# Patient Record
Sex: Male | Born: 1935 | Race: White | Hispanic: No | Marital: Married | Smoking: Former smoker
Health system: Southern US, Community
[De-identification: ages and names within clinical notes are randomized; demographics above are authoritative.]

## PROBLEM LIST (undated history)

## (undated) DIAGNOSIS — H3530 Unspecified macular degeneration: Secondary | ICD-10-CM

## (undated) DIAGNOSIS — F0390 Unspecified dementia without behavioral disturbance: Secondary | ICD-10-CM

## (undated) HISTORY — PX: OTHER SURGICAL HISTORY: SHX169

---

## 1998-02-27 ENCOUNTER — Ambulatory Visit (HOSPITAL_COMMUNITY): Admission: RE | Admit: 1998-02-27 | Discharge: 1998-02-27 | Payer: Self-pay | Admitting: Neurosurgery

## 1998-03-30 ENCOUNTER — Ambulatory Visit (HOSPITAL_COMMUNITY): Admission: RE | Admit: 1998-03-30 | Discharge: 1998-03-30 | Payer: Self-pay | Admitting: Neurosurgery

## 1998-04-12 ENCOUNTER — Ambulatory Visit (HOSPITAL_COMMUNITY): Admission: RE | Admit: 1998-04-12 | Discharge: 1998-04-12 | Payer: Self-pay | Admitting: Neurosurgery

## 1998-04-26 ENCOUNTER — Ambulatory Visit (HOSPITAL_COMMUNITY): Admission: RE | Admit: 1998-04-26 | Discharge: 1998-04-26 | Payer: Self-pay | Admitting: Neurosurgery

## 1998-05-16 ENCOUNTER — Ambulatory Visit (HOSPITAL_COMMUNITY): Admission: RE | Admit: 1998-05-16 | Discharge: 1998-05-16 | Payer: Self-pay | Admitting: Neurosurgery

## 1998-05-24 ENCOUNTER — Inpatient Hospital Stay (HOSPITAL_COMMUNITY): Admission: RE | Admit: 1998-05-24 | Discharge: 1998-05-26 | Payer: Self-pay | Admitting: Neurosurgery

## 2005-02-04 ENCOUNTER — Encounter: Admission: RE | Admit: 2005-02-04 | Discharge: 2005-02-04 | Payer: Self-pay | Admitting: Internal Medicine

## 2005-04-03 ENCOUNTER — Ambulatory Visit: Payer: Self-pay | Admitting: Internal Medicine

## 2005-04-08 ENCOUNTER — Ambulatory Visit (HOSPITAL_COMMUNITY): Admission: RE | Admit: 2005-04-08 | Discharge: 2005-04-08 | Payer: Self-pay | Admitting: Internal Medicine

## 2005-04-08 ENCOUNTER — Ambulatory Visit: Payer: Self-pay | Admitting: Internal Medicine

## 2007-06-17 ENCOUNTER — Inpatient Hospital Stay (HOSPITAL_COMMUNITY): Admission: RE | Admit: 2007-06-17 | Discharge: 2007-06-20 | Payer: Self-pay | Admitting: Orthopedic Surgery

## 2010-08-09 ENCOUNTER — Other Ambulatory Visit (HOSPITAL_COMMUNITY): Payer: Self-pay | Admitting: Internal Medicine

## 2010-08-09 ENCOUNTER — Ambulatory Visit (HOSPITAL_COMMUNITY)
Admission: RE | Admit: 2010-08-09 | Discharge: 2010-08-09 | Disposition: A | Discharge status: Home / Self Care | Payer: MEDICARE | Source: Ambulatory Visit | Attending: Internal Medicine | Admitting: Internal Medicine

## 2010-08-09 DIAGNOSIS — M899 Disorder of bone, unspecified: Secondary | ICD-10-CM | POA: Insufficient documentation

## 2010-08-09 DIAGNOSIS — M1610 Unilateral primary osteoarthritis, unspecified hip: Secondary | ICD-10-CM | POA: Insufficient documentation

## 2010-08-09 DIAGNOSIS — R109 Unspecified abdominal pain: Secondary | ICD-10-CM | POA: Insufficient documentation

## 2010-08-09 DIAGNOSIS — W19XXXA Unspecified fall, initial encounter: Secondary | ICD-10-CM | POA: Insufficient documentation

## 2010-08-09 DIAGNOSIS — M47817 Spondylosis without myelopathy or radiculopathy, lumbosacral region: Secondary | ICD-10-CM | POA: Insufficient documentation

## 2010-08-09 DIAGNOSIS — M169 Osteoarthritis of hip, unspecified: Secondary | ICD-10-CM | POA: Insufficient documentation

## 2010-08-09 DIAGNOSIS — M51379 Other intervertebral disc degeneration, lumbosacral region without mention of lumbar back pain or lower extremity pain: Secondary | ICD-10-CM | POA: Insufficient documentation

## 2010-08-09 DIAGNOSIS — M5137 Other intervertebral disc degeneration, lumbosacral region: Secondary | ICD-10-CM | POA: Insufficient documentation

## 2010-08-09 DIAGNOSIS — M25559 Pain in unspecified hip: Secondary | ICD-10-CM | POA: Insufficient documentation

## 2010-11-15 ENCOUNTER — Other Ambulatory Visit: Payer: Self-pay | Admitting: Internal Medicine

## 2010-11-15 DIAGNOSIS — R413 Other amnesia: Secondary | ICD-10-CM

## 2010-11-15 DIAGNOSIS — M545 Low back pain: Secondary | ICD-10-CM

## 2010-11-16 ENCOUNTER — Other Ambulatory Visit: Payer: MEDICARE

## 2010-11-20 ENCOUNTER — Other Ambulatory Visit: Payer: MEDICARE

## 2010-11-20 NOTE — H&P (Signed)
NAME:  Chase Fuller, Chase Fuller NO.:  [id number]   MEDICAL RECORD NO.:  [id number]          PATIENT TYPE:  INP   LOCATION:  1606                         FACILITY:  [facility]   PHYSICIAN:  Ollen Gross, M.D.    DATE OF BIRTH:  [birth year]   DATE OF ADMISSION:  06/17/2007  DATE OF DISCHARGE:                              HISTORY & PHYSICAL   Date of office visit history and physical June 09, 2007.  Date of  admission June 17, 2007.   CHIEF COMPLAINT:  Left knee pain.   HISTORY OF PRESENT ILLNESS:  The patient is 77 year old male well-known  Dr. Ollen Gross, having known end-stage arthritis of the left knee  with bone-on-bone and a valgus malalignment deformity.  It has been  progressive in nature, and he has reached the point where he would like  to have something done about it.  Risks and benefits have been  discussed, and he elects to proceed with surgery.   ALLERGIES:  NO KNOWN DRUG ALLERGIES.   CURRENT MEDICATIONS:  None.   PAST MEDICAL HISTORY:  Negative with exception of history of a back  fracture treated conservatively.   PAST SURGICAL HISTORY:  Back surgery x2 for nerves and bilateral  shoulder surgery.   SOCIAL HISTORY:  Married, retired, one-pack-per-day smoker.  No alcohol.  Two children.   FAMILY HISTORY:  Father deceased at age 78 secondary to MI.  Mother  deceased age at 70 in good health other than a hip fracture.   REVIEW OF SYSTEMS:  GENERAL:  No fevers, chills or night sweats.  NEUROLOGICAL:  No seizures, syncope or paralysis.  RESPIRATORY:  No  shortness breath, productive cough or hemoptysis.  CARDIOVASCULAR:  No  chest pain, angina, orthopnea.  GASTROINTESTINAL:  No nausea, vomiting,  diarrhea, constipation.  GENITOURINARY:  No dysuria, hematuria,  discharge.  MUSCULOSKELETAL:  Left knee.   PHYSICAL EXAMINATION:  VITAL SIGNS:  Pulse 76, respirations 14, blood  pressure 148/72.  GENERAL:  An 77 year old, tall, slender, white male, thin  frame no acute  distress. Average historian.  Alert, oriented and cooperative.  HEENT:  Normocephalic, atraumatic.  Pupils round and reactive.  Oropharynx clear.  EOMs intact.  NECK:  Supple.  CHEST:  Clear to anterior/posterior chest walls.  No rhonchi, rales or  wheezing.  HEART:  Regular rate and rhythm.  Systolic ejection murmur grade 2/6  best heard over aortic point.  ABDOMEN:  Soft, flat, nontender.  Bowel sounds present.  RECTAL, BREAST, GENITALIA:  Not done and not pertinent to present  illness.  EXTREMITIES:  Left knee range of motion 1 to 135, marked crepitus.  He  has a little bit of pseudolaxity with some varus stressing, comes back  to normal alignment.   IMPRESSION:  Osteoarthritis, left knee.   PLAN:  The patient was admitted to the [facility] to undergo a  left total knee replacement arthroplasty.  Surgery will be performed by  Ollen Gross.      Alexzandrew L. Perkins, P.A.C.      Ollen Gross, M.D.  Electronically Signed  ALP/MEDQ  D:  06/16/2007  T:  06/17/2007  Job:  [id number]   cc:   Ollen Gross, M.D.  Fax: [phone]   Geoffry Paradise, M.D.  Fax: [phone]

## 2010-11-20 NOTE — Op Note (Signed)
NAME:  Chase Fuller, Chase Fuller NO.:  [id number]   MEDICAL RECORD NO.:  [id number]          PATIENT TYPE:  INP   LOCATION:  0001                         FACILITY:  [facility]   PHYSICIAN:  Ollen Gross, M.D.    DATE OF BIRTH:  1922-03-25   DATE OF PROCEDURE:  06/17/2007  DATE OF DISCHARGE:                               OPERATIVE REPORT   PREOPERATIVE DIAGNOSIS:  Osteoarthritis, left knee.   POSTOPERATIVE DIAGNOSIS:  Osteoarthritis, left knee.   PROCEDURE:  Left total knee arthroplasty.   SURGEON:  Ollen Gross, M.D.   ASSISTANT:  Avel Peace, P.A.-C.   ANESTHESIA:  Spinal.   ESTIMATED BLOOD LOSS:  Minimal.   DRAINS:  None.   TOURNIQUET TIME:  34 minutes at 300 mmHg.   COMPLICATIONS:  None.   CONDITION:  Stable to recovery.   BRIEF CLINICAL NOTE:  Chase Fuller is an 77 year old male with end-stage  valgus arthritis of his left knee with progressively worsening pain and  dysfunction.  He has failed nonoperative management and presents for  total knee arthroplasty.   PROCEDURE IN DETAIL:  After successful administration of spinal  anesthetic, a tourniquet was placed high on the left thigh and left  lower extremity prepped and draped in usual sterile fashion.  Extremities wrapped in Esmarch, knee flexed, tourniquet inflated to 300  mmHg.  Midline incision made with a 10 blade through subcutaneous tissue  to the level of the extensor mechanism.  A fresh blade was used make a  lateral parapatellar arthrotomy.  I made the lateral portion to do his  valgus deformity.  Soft tissue of the proximal lateral tibia  subperiosteally elevated to the joint line with the knife and around to  the posterolateral corner but not including the structures of the  posterolateral corner.  The patella was everted medially, knee flexed 90  degrees, ACL and PCL removed.  Drill was used create a starting hole in  the distal femur, and canal was thoroughly irrigated.  A 5-degree left  valgus  alignment guide was placed, and referencing off the posterior  condyles, rotation was marked and the block pinned to remove 10 mm off  the distal femur.  Distal femoral resection was made with an oscillating  saw.  Sizing blocks placed; size 4 was most appropriate.  Rotations  marked off the epicondylar axis.  A size 4 cutting block was placed, and  the anterior, posterior and chamfer cuts were made.   Tibia subluxed forward and menisci were removed.  The extramedullary  tibial alignment guide was placed referencing proximally at the medial  aspect of the tibial tubercle and distally along the second metatarsal  axis and tibial crest.  The block was pinned to remove approximately 2  mm off the deficient lateral side.  Tibial resection was made with an  oscillating saw.  Size 3 was the most appropriate tibial component, and  the proximal tibia was prepared with the modular drill and keel punch  for a size 3.  Femoral preparation was completed with the intercondylar  cut.   Size 3 mobile bearing  tibial trial, size 4 posterior stabilized femoral  trial and a 12.5-mm posterior stabilized rotating platform insert trial  were placed.  With the 12.5, full extension was achieved with excellent  varus and valgus balance, and anterior and posterior balance throughout  full range of motion.  Patella was everted and thickness measured to be  25 mm.  Freehand resection was taken to 15 mm, 38 template was placed,  lug holes were drilled, trial patella was placed and it tracked  normally.  Osteophytes removed off the posterior femur with the trial in  place.  All trials were removed, and the cut bone surfaces were prepared  with pulsatile lavage.  Cement was mixed, and once ready for  implantation, the size 3 mobile bearing tibial tray, size 4 posterior  stabilized femur and 38 patella were cemented in place.  Patella was  held with clamp.  Trial 12.5 inserts were placed, knee held in full  extension  and all extruded cement removed.  When the cement was fully  hardened, then the wound was copiously irrigated with saline solution,  trial removed and FloSeal injected on the posterior capsule.  The  permanent 12.5-mm posterior stabilized rotating platform insert was  placed into the tibial tray.  The FloSeal was then injected in the  medial and lateral gutters and suprapatellar area.  Tourniquet was  released for total time of 34 minutes.  Moist sponge was placed for  about 2 minutes.  It was then removed, and the minimal bleeding was  encountered.  That which was encountered was stopped with  electrocautery.  The wound was again irrigated and the arthrotomy closed  with interrupted #1 PDS leaving open a small area from the superior to  inferior pole of the patella to serve as a mini lateral release.  The  flexion against gravity was 140 degrees and the patella tracked  normally.  Subcutaneous tissue was closed with interrupted 2-0 Vicryl  and subcuticular running 4-0 Monocryl.  The incisions were cleaned and  dried and Steri-Strips and a bulky sterile dressing applied.  He was  then awakened and transferred to recovery in stable condition.      Ollen Gross, M.D.  Electronically Signed     FA/MEDQ  D:  06/17/2007  T:  06/17/2007  Job:  [id number]

## 2010-11-23 NOTE — Discharge Summary (Signed)
NAME:  Chase Fuller, Chase Fuller NO.:  [id number]   MEDICAL RECORD NO.:  [id number]          PATIENT TYPE:  INP   LOCATION:  1606                         FACILITY:  [facility]   PHYSICIAN:  Ollen Gross, M.D.    DATE OF BIRTH:  01/30/1922   DATE OF ADMISSION:  06/17/2007  DATE OF DISCHARGE:  06/20/2007                               DISCHARGE SUMMARY   ADMITTING DIAGNOSIS:  Left knee osteoarthritis.   DISCHARGE DIAGNOSIS:  1. Osteoarthritis, left knee, status post left total knee      arthroplasty.  2. Mild postop blood loss anemia, did not require transfusion.   PROCEDURE:  June 17, 2007, left total knee.  Surgeon:  Dr. Lequita Halt.  Assistant:  Avel Peace PA-C. Performed under spinal anesthesia.   CONSULTATIONS:  None.   BRIEF HISTORY:  Chase Fuller is an 77 year old male with end-stage arthritis  of left knee, progressively worsening pain and dysfunction, failed  medical management, now presents for total knee.   LABORATORY DATA:  Preop CBC showed hemoglobin of 14.79, hematocrit 43,  white cell count 6.4. Postop hemoglobin 11.3, came up to 11.4.  Last  hemoglobin 10.8, hematocrit 31.5.  PT and PTT preop 12.8 and 31,  respectively.  INR 0.9.  Serial pro times followed.  Last PT 21.4, INR  1.8.  Chemistry panel on admission all within normal limits.  Serial  BMETs were followed.  Electrolytes remained within normal limits.  Preop  UA negative.  Blood group type B+.   Preop EKG March 03, 2007:  Normal sinus rhythm confirmed, Dr. Jacky Kindle.   HOSPITAL COURSE:  The patient was admitted to [facility],  tolerated procedure well, later transferred to the recovery room and  then orthopedic floor. Started on PCA and p.o. analgesics.  Actually did  really well on the morning of day #1, discontinued his PCA.  Had decent  output.  Hemoglobin stable.  Started to get up with therapy. By day #2,  he was already walking 200 feet.  Dressing changed, incision looked  good.  He  was progressing very well.  Weaned over to p.o. medications,  tolerating well.  He was ready to go home by day #3 on June 20, 2007.   DISCHARGE/PLAN:  1. The patient was discharged home on June 20, 2007.  2. For Discharge Diagnoses, please see above.  3. Discharge medications were Darvocet, Robaxin, Coumadin.   ACTIVITY:  Weightbearing as tolerated, total knee protocol.  Home health  PT and home health nursing   FOLLOWUP:  Follow up 2 weeks.   DISPOSITION:  Home.   CONDITION ON DISCHARGE:  Improving.      Alexzandrew L. Perkins, P.A.C.      Ollen Gross, M.D.  Electronically Signed    ALP/MEDQ  D:  08/11/2007  T:  08/11/2007  Job:  [id number]   cc:   Geoffry Paradise, M.D.  Fax: [phone]

## 2010-11-28 ENCOUNTER — Ambulatory Visit
Admission: RE | Admit: 2010-11-28 | Discharge: 2010-11-28 | Disposition: A | Discharge status: Home / Self Care | Payer: Medicare Other | Source: Ambulatory Visit | Attending: Internal Medicine | Admitting: Internal Medicine

## 2010-11-28 ENCOUNTER — Other Ambulatory Visit: Payer: Self-pay | Admitting: Internal Medicine

## 2010-11-28 DIAGNOSIS — M545 Low back pain, unspecified: Secondary | ICD-10-CM

## 2010-11-28 DIAGNOSIS — R413 Other amnesia: Secondary | ICD-10-CM

## 2011-04-15 LAB — COMPREHENSIVE METABOLIC PANEL
ALT: 8
AST: 13
Albumin: 3.6
Alkaline Phosphatase: 81
BUN: 12
CO2: 26
Calcium: 10
Chloride: 106
Creatinine, Ser: 0.91
GFR calc Af Amer: >60
GFR calc non Af Amer: >60
Glucose, Bld: 88
Potassium: 4.2
Sodium: 140
Total Bilirubin: 0.9
Total Protein: 7

## 2011-04-15 LAB — CBC
HCT: 31.5 — ABNORMAL LOW
HCT: 32.6 — ABNORMAL LOW
HCT: 32.9 — ABNORMAL LOW
HCT: 43
Hemoglobin: 10.8 — ABNORMAL LOW
Hemoglobin: 11.3 — ABNORMAL LOW
Hemoglobin: 11.4 — ABNORMAL LOW
Hemoglobin: 14.7
MCHC: 34.1
MCHC: 34.2
MCHC: 34.5
MCHC: 34.8
MCV: 93.7
MCV: 93.8
MCV: 94.4
MCV: 94.7
Platelets: 193
Platelets: 207
Platelets: 214
Platelets: 277
RBC: 3.36 — ABNORMAL LOW
RBC: 3.47 — ABNORMAL LOW
RBC: 3.49 — ABNORMAL LOW
RBC: 4.54
RDW: 13.2
RDW: 13.3
RDW: 13.9
RDW: 14.1
WBC: 6.4
WBC: 8.6
WBC: 9.6
WBC: 9.8

## 2011-04-15 LAB — BASIC METABOLIC PANEL
BUN: 7
BUN: 9
CO2: 25
CO2: 26
Calcium: 8.4
Calcium: 8.8
Chloride: 105
Chloride: 108
Creatinine, Ser: 0.94
Creatinine, Ser: 0.98
GFR calc Af Amer: >60
GFR calc Af Amer: >60
GFR calc non Af Amer: >60
GFR calc non Af Amer: >60
Glucose, Bld: 119 — ABNORMAL HIGH
Glucose, Bld: 143 — ABNORMAL HIGH
Potassium: 4
Potassium: 4.2
Sodium: 135
Sodium: 139

## 2011-04-15 LAB — URINALYSIS, ROUTINE W REFLEX MICROSCOPIC
Bilirubin Urine: NEGATIVE
Glucose, UA: NEGATIVE
Hgb urine dipstick: NEGATIVE
Ketones, ur: NEGATIVE
Nitrite: NEGATIVE
Protein, ur: NEGATIVE
Specific Gravity, Urine: 1.011
Urobilinogen, UA: 0.2
pH: 6

## 2011-04-15 LAB — APTT: aPTT: 31

## 2011-04-15 LAB — TYPE AND SCREEN
ABO/RH(D): B POS
Antibody Screen: NEGATIVE

## 2011-04-15 LAB — PROTIME-INR
INR: 0.9
INR: 1.1
INR: 1.9 — ABNORMAL HIGH
Prothrombin Time: 12.8
Prothrombin Time: 14.9
Prothrombin Time: 21.4 — ABNORMAL HIGH
Prothrombin Time: 21.9 — ABNORMAL HIGH

## 2011-04-15 LAB — ABO/RH: ABO/RH(D): B POS

## 2014-04-23 ENCOUNTER — Emergency Department (HOSPITAL_COMMUNITY)
Admission: EM | Admit: 2014-04-23 | Discharge: 2014-04-23 | Disposition: A | Discharge status: Home / Self Care | Payer: Medicare Other | Attending: Emergency Medicine | Admitting: Emergency Medicine

## 2014-04-23 ENCOUNTER — Encounter (HOSPITAL_COMMUNITY): Payer: Self-pay | Admitting: Emergency Medicine

## 2014-04-23 DIAGNOSIS — Z79899 Other long term (current) drug therapy: Secondary | ICD-10-CM | POA: Insufficient documentation

## 2014-04-23 DIAGNOSIS — Z720 Tobacco use: Secondary | ICD-10-CM | POA: Diagnosis not present

## 2014-04-23 DIAGNOSIS — R55 Syncope and collapse: Secondary | ICD-10-CM | POA: Diagnosis not present

## 2014-04-23 DIAGNOSIS — F0390 Unspecified dementia without behavioral disturbance: Secondary | ICD-10-CM | POA: Diagnosis not present

## 2014-04-23 DIAGNOSIS — E860 Dehydration: Secondary | ICD-10-CM

## 2014-04-23 DIAGNOSIS — I4891 Unspecified atrial fibrillation: Secondary | ICD-10-CM | POA: Diagnosis not present

## 2014-04-23 HISTORY — DX: Unspecified dementia, unspecified severity, without behavioral disturbance, psychotic disturbance, mood disturbance, and anxiety: F03.90

## 2014-04-23 HISTORY — DX: Unspecified macular degeneration: H35.30

## 2014-04-23 LAB — URINALYSIS, ROUTINE W REFLEX MICROSCOPIC
Bilirubin Urine: NEGATIVE
GLUCOSE, UA: NEGATIVE mg/dL
HGB URINE DIPSTICK: NEGATIVE
KETONES UR: NEGATIVE mg/dL
Leukocytes, UA: NEGATIVE
Nitrite: NEGATIVE
PROTEIN: 30 mg/dL — AB
Specific Gravity, Urine: 1.019 (ref 1.005–1.030)
Urobilinogen, UA: 1 mg/dL (ref 0.0–1.0)
pH: 6 (ref 5.0–8.0)

## 2014-04-23 LAB — BASIC METABOLIC PANEL
ANION GAP: 13 (ref 5–15)
BUN: 17 mg/dL (ref 6–23)
CALCIUM: 9.6 mg/dL (ref 8.4–10.5)
CO2: 24 meq/L (ref 19–32)
CREATININE: 1.09 mg/dL (ref 0.50–1.35)
Chloride: 103 mEq/L (ref 96–112)
GFR calc Af Amer: 67 mL/min — ABNORMAL LOW (ref >90)
GFR calc non Af Amer: 58 mL/min — ABNORMAL LOW (ref >90)
Glucose, Bld: 104 mg/dL — ABNORMAL HIGH (ref 70–99)
Potassium: 4.1 mEq/L (ref 3.7–5.3)
Sodium: 140 mEq/L (ref 137–147)

## 2014-04-23 LAB — CBC WITH DIFFERENTIAL/PLATELET
BASOS ABS: 0.1 10*3/uL (ref 0.0–0.1)
BASOS PCT: 1 % (ref 0–1)
EOS PCT: 0 % (ref 0–5)
Eosinophils Absolute: 0 10*3/uL (ref 0.0–0.7)
HEMATOCRIT: 43.1 % (ref 39.0–52.0)
Hemoglobin: 14.1 g/dL (ref 13.0–17.0)
Lymphocytes Relative: 9 % — ABNORMAL LOW (ref 12–46)
Lymphs Abs: 1 10*3/uL (ref 0.7–4.0)
MCH: 30.7 pg (ref 26.0–34.0)
MCHC: 32.7 g/dL (ref 30.0–36.0)
MCV: 93.9 fL (ref 78.0–100.0)
MONO ABS: 0.6 10*3/uL (ref 0.1–1.0)
Monocytes Relative: 6 % (ref 3–12)
Neutro Abs: 8.5 10*3/uL — ABNORMAL HIGH (ref 1.7–7.7)
Neutrophils Relative %: 84 % — ABNORMAL HIGH (ref 43–77)
Platelets: 244 10*3/uL (ref 150–400)
RBC: 4.59 MIL/uL (ref 4.22–5.81)
RDW: 13.6 % (ref 11.5–15.5)
WBC: 10.2 10*3/uL (ref 4.0–10.5)

## 2014-04-23 LAB — POC OCCULT BLOOD, ED: Fecal Occult Bld: POSITIVE — AB

## 2014-04-23 LAB — URINE MICROSCOPIC-ADD ON

## 2014-04-23 LAB — I-STAT CG4 LACTIC ACID, ED: Lactic Acid, Venous: 2.72 mmol/L — ABNORMAL HIGH (ref 0.5–2.2)

## 2014-04-23 LAB — I-STAT TROPONIN, ED: Troponin i, poc: 0.03 ng/mL (ref 0.00–0.08)

## 2014-04-23 MED ORDER — SODIUM CHLORIDE 0.9 % IV BOLUS (SEPSIS)
500.0000 mL | Freq: Once | INTRAVENOUS | Status: AC
  Administered 2014-04-23: 500 mL via INTRAVENOUS

## 2014-04-23 MED ORDER — SODIUM CHLORIDE 0.9 % IV SOLN
Freq: Once | INTRAVENOUS | Status: AC
  Administered 2014-04-23: 22:00:00 via INTRAVENOUS

## 2014-04-23 NOTE — Discharge Instructions (Signed)
Take aspirin daily until you see your Dr. Return to the ER she develop chest pain, shortness of breath, pass out or persistent lightheadedness. If you have gross blood in your stools return to the ER as well.  If you were given medicines take as directed.  If you are on coumadin or contraceptives realize their levels and effectiveness is altered by many different medicines.  If you have any reaction (rash, tongues swelling, other) to the medicines stop taking and see a physician.   Please follow up as directed and return to the ER or see a physician for new or worsening symptoms.  Thank you. Filed Vitals:   04/23/14 1930 04/23/14 2000 04/23/14 2045 04/23/14 2129  BP:  110/56 120/69 115/58  Pulse: 80 65 75   Temp:      TempSrc:      Resp: 22 16 16    SpO2: 100% 99% 96%

## 2014-04-23 NOTE — ED Notes (Addendum)
Per EMS, [place] near-sync[place] episode. Pt reported upper abdominal pain, denies n/v or chest pain/SOB. Pt has h/o dementia and is a smoker. NAD noted. VSS: BP 114/79, P58, 95% rm air, cbg106. 1st degree heart block noted on monitor. 18G placed in R/L anticubital, 500cc LR bolus given. .Marland Kitchen

## 2014-04-23 NOTE — ED Notes (Addendum)
Dr Jodi MourningZavitz informed by phone of lactic acid results 2.72

## 2014-04-23 NOTE — ED Provider Notes (Signed)
CSN: [id number]     Arrival date & time 04/23/14  1733 History   First MD Initiated Contact with Patient 04/23/14 1735     Chief Complaint  Patient presents with  . Near Syncope     (Consider location/radiation/quality/duration/timing/severity/associated sxs/prior Treatment) HPI Comments: 80 year old male with history of dementia, smoking [place] from [place] after having near syncopal episode. Patient complained of brief upper abdominal discomfort that has resolved since and was lightheaded with family having to light him down flat improves symptoms. Patient's blood pressure was 70s on arrival improved to 110 with fluid bolus. Family on route. Patient has dementia and is at baseline per report. Patient denies all symptoms at this time.  Patient is a 80 y.o. male presenting with near-syncope. The history is provided by the patient, a relative and the EMS personnel.  Near Syncope    Past Medical History  Diagnosis Date  . Dementia   . Macular degeneration    Past Surgical History  Procedure Laterality Date  . Rotator cuff surgery     History reviewed. No pertinent family history. History  Substance Use Topics  . Smoking status: Current Every Day Smoker -- 1.00 packs/day    Types: Cigarettes  . Smokeless tobacco: Not on file  . Alcohol Use: Not on file    Review of Systems  Unable to perform ROS: Dementia  Cardiovascular: Positive for near-syncope.      Allergies  Review of patient's allergies indicates no known allergies.  Home Medications   Prior to Admission medications   Medication Sig Start Date End Date Taking? Authorizing Provider  donepezil (ARICEPT) 10 MG tablet Take 10 mg by mouth at bedtime.   Yes Historical Provider, MD  memantine (NAMENDA) 10 MG tablet Take 10 mg by mouth 2 (two) times daily.   Yes Historical Provider, MD   BP 124/97  Pulse 65  Temp(Src) 97.5 F (36.4 C) (Oral)  Resp 16  SpO2 96% Physical Exam  Nursing note and vitals  reviewed. Constitutional: He appears well-developed and well-nourished.  HENT:  Head: Normocephalic and atraumatic.  Mild dry mucous membranes  Eyes: Conjunctivae are normal. Right eye exhibits no discharge. Left eye exhibits no discharge.  Neck: Normal range of motion. Neck supple. No tracheal deviation present.  Cardiovascular: Normal rate, regular rhythm and intact distal pulses.   Pulmonary/Chest: Effort normal and breath sounds normal.  Abdominal: Soft. He exhibits no distension. There is no tenderness. There is no guarding.  Musculoskeletal: He exhibits no edema.  Neurological: He is alert. GCS eye subscore is 4. GCS motor subscore is 6.  Pleasant dementia No arm drift or facial droop. Extraocular muscle function intact, pupils equal bilateral, equal strength bilateral upper and lower extremities 5+. Neck supple no meningismus.  Skin: Skin is warm. No rash noted.    ED Course  Procedures (including critical care time) EMERGENCY DEPARTMENT ULTRASOUND  Study: Limited Retroperitoneal Ultrasound of the Abdominal Aorta.  INDICATIONS:Hypotension, Abdominal pain and Age>55 Multiple views of the abdominal aorta were obtained in real-time from the diaphragmatic hiatus to the aortic bifurcation in transverse planes with a multi-frequency probe. PERFORMED BY: Myself IMAGES ARCHIVED?: Yes FINDINGS: Maximum aortic dimensions are <3 cm LIMITATIONS:  Bowel gas INTERPRETATION:  No abdominal aortic aneurysm    Labs Review Labs Reviewed  BASIC METABOLIC PANEL - Abnormal; Notable for the following:    Glucose, Bld 104 (*)    GFR calc non Af Amer 58 (*)    GFR calc Af Amer 67 (*)  All other components within normal limits  CBC WITH DIFFERENTIAL - Abnormal; Notable for the following:    Neutrophils Relative % 84 (*)    Neutro Abs 8.5 (*)    Lymphocytes Relative 9 (*)    All other components within normal limits  URINALYSIS, ROUTINE W REFLEX MICROSCOPIC - Abnormal; Notable for the  following:    Protein, ur 30 (*)    All other components within normal limits  URINE MICROSCOPIC-ADD ON - Abnormal; Notable for the following:    Casts HYALINE CASTS (*)    All other components within normal limits  POC OCCULT BLOOD, ED - Abnormal; Notable for the following:    Fecal Occult Bld POSITIVE (*)    All other components within normal limits  I-STAT CG4 LACTIC ACID, ED - Abnormal; Notable for the following:    Lactic Acid, Venous 2.72 (*)    All other components within normal limits  I-STAT TROPOININ, ED    Imaging Review No results found.   EKG Interpretation   Date/Time:  Saturday April 23 2014 18:09:10 EDT Ventricular Rate:  80 PR Interval:    QRS Duration: 141 QT Interval:  457 QTC Calculation: 527 R Axis:   -70 Text Interpretation:  Atrial fibrillation Ventricular premature complex  IVCD, consider atypical RBBB Confirmed by Ariv Penrod  MD, Daylon Lafavor ([id number]) on  04/23/2014 8:41:24 PM      MDM   Final diagnoses:  Near syncope  Dehydration  Atrial fibrillation, unspecified   Patient presents after lightheaded near syncopal episode, vitals normal on arrival, patient denies all symptoms at this time, family on route. Plan for screening blood work, brown stool bedside Hemoccult positive, no abdominal pain, mild lactic acidosis. Clinically dehydrated. Fluid bolus pending. With abdominal pain lightheadedness bedside ultrasound done no AAA. No free fluid in the belly seen.  Patient has no symptoms during for observation ER. Initially plan for observation/telemetry, discussed this with the family and with primary care Dr. on call. Blood work reassuring, no symptoms, no tenderness or pain. EKG did show atrial fibrillation with PVCs, IVCD/right bundle branch block. Primary care Dr. on call brought up a fairly recent EKG which also had widening and right bundle branch block. Primary care is comfortable and recommends following closely on Monday or Tuesday outpatient. Discussed  starting the patient on aspirin until patient is seen.  Results and differential diagnosis were discussed with the patient/parent/guardian. Close follow up outpatient was discussed, comfortable with the plan.   Medications  sodium chloride 0.9 % bolus 500 mL (0 mLs Intravenous Stopped 04/23/14 2100)  0.9 %  sodium chloride infusion ( Intravenous Stopped 04/23/14 2200)    Filed Vitals:   04/23/14 2045 04/23/14 2115 04/23/14 2129 04/23/14 2145  BP: 120/69 115/58 115/58 116/75  Pulse: 75 64    Temp:      TempSrc:      Resp: 16 14  16   SpO2: 96% 99%      Final diagnoses:  Near syncope  Dehydration  Atrial fibrillation, unspecified         Enid SkeensJoshua M Jamyron Redd, MD 04/23/14 2204

## 2015-03-27 ENCOUNTER — Emergency Department (HOSPITAL_COMMUNITY): Payer: Medicare Other

## 2015-03-27 ENCOUNTER — Emergency Department (HOSPITAL_COMMUNITY)
Admission: EM | Admit: 2015-03-27 | Discharge: 2015-03-27 | Disposition: A | Discharge status: Home / Self Care | Payer: Medicare Other | Attending: Emergency Medicine | Admitting: Emergency Medicine

## 2015-03-27 ENCOUNTER — Encounter (HOSPITAL_COMMUNITY): Payer: Self-pay | Admitting: *Deleted

## 2015-03-27 DIAGNOSIS — F0390 Unspecified dementia without behavioral disturbance: Secondary | ICD-10-CM | POA: Insufficient documentation

## 2015-03-27 DIAGNOSIS — R079 Chest pain, unspecified: Secondary | ICD-10-CM | POA: Insufficient documentation

## 2015-03-27 DIAGNOSIS — Z87891 Personal history of nicotine dependence: Secondary | ICD-10-CM | POA: Insufficient documentation

## 2015-03-27 DIAGNOSIS — Z79899 Other long term (current) drug therapy: Secondary | ICD-10-CM | POA: Insufficient documentation

## 2015-03-27 LAB — BASIC METABOLIC PANEL
ANION GAP: 9 (ref 5–15)
BUN: 15 mg/dL (ref 6–20)
CALCIUM: 9.3 mg/dL (ref 8.9–10.3)
CO2: 24 mmol/L (ref 22–32)
Chloride: 105 mmol/L (ref 101–111)
Creatinine, Ser: 1.08 mg/dL (ref 0.61–1.24)
GFR, EST NON AFRICAN AMERICAN: 58 mL/min — AB (ref >60)
GLUCOSE: 104 mg/dL — AB (ref 65–99)
Potassium: 3.6 mmol/L (ref 3.5–5.1)
Sodium: 138 mmol/L (ref 135–145)

## 2015-03-27 LAB — CBC
HEMATOCRIT: 41.8 % (ref 39.0–52.0)
Hemoglobin: 13.7 g/dL (ref 13.0–17.0)
MCH: 31 pg (ref 26.0–34.0)
MCHC: 32.8 g/dL (ref 30.0–36.0)
MCV: 94.6 fL (ref 78.0–100.0)
PLATELETS: 196 10*3/uL (ref 150–400)
RBC: 4.42 MIL/uL (ref 4.22–5.81)
RDW: 14 % (ref 11.5–15.5)
WBC: 6.8 10*3/uL (ref 4.0–10.5)

## 2015-03-27 LAB — I-STAT TROPONIN, ED: Troponin i, poc: 0.03 ng/mL (ref 0.00–0.08)

## 2015-03-27 MED ORDER — KETOROLAC TROMETHAMINE 30 MG/ML IJ SOLN
30.0000 mg | Freq: Once | INTRAMUSCULAR | Status: DC
  Filled 2015-03-27: qty 1

## 2015-03-27 MED ORDER — MORPHINE SULFATE (PF) 4 MG/ML IV SOLN
4.0000 mg | Freq: Once | INTRAVENOUS | Status: DC
  Filled 2015-03-27: qty 1

## 2015-03-27 MED ORDER — HYDROCODONE-ACETAMINOPHEN 5-325 MG PO TABS: 1.0000 | ORAL_TABLET | ORAL | Status: AC | PRN

## 2015-03-27 MED ORDER — IBUPROFEN 600 MG PO TABS
600.0000 mg | ORAL_TABLET | Freq: Three times a day (TID) | ORAL | Status: AC | PRN
Start: 2015-03-27 — End: ?

## 2015-03-27 MED ORDER — IOHEXOL 350 MG/ML SOLN
80.0000 mL | Freq: Once | INTRAVENOUS | Status: AC | PRN
  Administered 2015-03-27: 100 mL via INTRAVENOUS

## 2015-03-27 NOTE — ED Notes (Signed)
Patient transported to CT 

## 2015-03-27 NOTE — ED Provider Notes (Signed)
CSN: [id number]     Arrival date & time 03/27/15  0223 History  This chart was scribed for Azalia Bilis, MD by Evon Slack, ED Scribe. This patient was seen in room B17C/B17C and the patient's care was started at 3:42 AM.    Chief Complaint  Patient presents with  . Chest Pain   The history is provided by a relative and the patient. No language interpreter was used.   HPI Comments: Level 5 Caveat: Dementia NAZIAH WECKERLY is a 81 y.o. male with PMHx of dementia who presents to the Emergency Department complaining of left CP onset 1 day prior. Family states that he has been complaining left abdomen, left chest and left leg since yesterday. Deep breathing and palpating the left chest makes the pain worse. Family states that they are unsure if he has fallen. Family doesn't report any medications PTA. Denies fever, v/d/ or other related symptoms.    Past Medical History  Diagnosis Date  . Dementia   . Macular degeneration    Past Surgical History  Procedure Laterality Date  . Rotator cuff surgery     No family history on file. Social History  Substance Use Topics  . Smoking status: Former Smoker -- 1.00 packs/day    Types: Cigarettes  . Smokeless tobacco: None  . Alcohol Use: None    Review of Systems  Unable to perform ROS: Dementia  Constitutional: Negative for fever.  Cardiovascular: Positive for chest pain.  Gastrointestinal: Negative for vomiting and diarrhea.     Allergies  Review of patient's allergies indicates no known allergies.  Home Medications   Prior to Admission medications   Medication Sig Start Date End Date Taking? Authorizing Provider  donepezil (ARICEPT) 10 MG tablet Take 10 mg by mouth at bedtime.    Historical Provider, MD  memantine (NAMENDA) 10 MG tablet Take 10 mg by mouth 2 (two) times daily.    Historical Provider, MD   BP 134/77 mmHg  Pulse 60  Temp(Src) 97.5 F (36.4 C) (Oral)  Resp 18  Ht 6' (1.829 m)  Wt 125 lb (56.7 kg)  BMI 16.95  kg/m2  SpO2 99%   Physical Exam  Constitutional: He appears well-developed and well-nourished.  HENT:  Head: Normocephalic and atraumatic.  Eyes: EOM are normal.  Neck: Normal range of motion.  Cardiovascular: Normal rate, regular rhythm, normal heart sounds and intact distal pulses.   Pulmonary/Chest: Effort normal and breath sounds normal. No respiratory distress. He exhibits tenderness.  Left cehst wall tenderness.   Abdominal: Soft. He exhibits no distension. There is no tenderness.  Musculoskeletal: Normal range of motion.  Neurological: He is alert.  Alert and oriented x1.  Skin: Skin is warm and dry.  Psychiatric: He has a normal mood and affect. Judgment normal.  Nursing note and vitals reviewed.   ED Course  Procedures (including critical care time) DIAGNOSTIC STUDIES: Oxygen Saturation is 94% on RA, adequate by my interpretation.    COORDINATION OF CARE: 3:49 AM-Discussed treatment plan with family at bedside and family agreed to plan.     Labs Review Labs Reviewed  BASIC METABOLIC PANEL - Abnormal; Notable for the following:    Glucose, Bld 104 (*)    GFR calc non Af Amer 58 (*)    All other components within normal limits  CBC  I-STAT TROPOININ, ED    Imaging Review Dg Chest 2 View  03/27/2015   CLINICAL DATA:  Chest pain this evening, former smoker  EXAM: CHEST  2 VIEW  COMPARISON:  10/05/2013  FINDINGS: Enlargement of cardiac silhouette.  Atherosclerotic calcification of a tortuous thoracic aorta.  Pulmonary vascularity normal.  Emphysematous and bronchitic changes consistent with COPD.  Scattered interstitial prominence and parenchymal scarring.  No acute infiltrate, pleural effusion or pneumothorax.  Bones demineralized.  IMPRESSION: Changes of COPD and chronic lung disease.  No acute abnormalities.   Electronically Signed   By: Ulyses Southward M.D.   On: 03/27/2015 02:54   Ct Angio Chest Pe W/cm &/or Wo Cm  03/27/2015   CLINICAL DATA:  Left-sided chest pain.   EXAM: CT ANGIOGRAPHY CHEST WITH CONTRAST  TECHNIQUE: Multidetector CT imaging of the chest was performed using the standard protocol during bolus administration of intravenous contrast. Multiplanar CT image reconstructions and MIPs were obtained to evaluate the vascular anatomy.  CONTRAST:  OMNIPAQUE IOHEXOL 350 MG/ML SOLN  COMPARISON:  10/05/2013  FINDINGS: Technically adequate study with good opacification of the central and segmental pulmonary arteries. No focal filling defects. No evidence of significant pulmonary embolus.  Normal heart size. Small pericardial effusion. Coronary artery calcifications. Normal caliber thoracic aorta with calcification. Esophagus is decompressed. No significant lymphadenopathy in the chest.  Prominent emphysematous changes throughout the lungs with apical bulla. No focal consolidation or airspace disease. Atelectasis and linear scarring in the lung bases. No pleural effusions. No pneumothorax.  Included portions of the upper abdominal organs are grossly unremarkable. Degenerative changes in the spine. No destructive bone lesions.  Review of the MIP images confirms the above findings.  IMPRESSION: No evidence of significant pulmonary embolus. Emphysematous changes throughout the lungs. Atelectasis and scarring in the lung bases. No evidence of active consolidation.   Electronically Signed   By: Burman Nieves M.D.   On: 03/27/2015 06:35      EKG Interpretation   Date/Time:  Monday March 27 2015 02:31:12 EDT Ventricular Rate:  81 PR Interval:  268 QRS Duration: 134 QT Interval:  400 QTC Calculation: 464 R Axis:   -41 Text Interpretation:  Sinus rhythm with 1st degree A-V block with  Premature atrial complexes Left axis deviation Right bundle branch block T  wave abnormality, consider lateral ischemia Abnormal ECG nonspecific  changes as compared to prior ecg Confirmed by CAMPOS  MD, KEVIN ([id number]) on  03/27/2015 2:56:10 AM      MDM   Final  diagnoses:  Chest pain    Diffley and tenderness in left lateral chest.  CT scans without acute pathology.  No pulmonary embolism.  Rib fractures.  Patient be treated with anti-inflammatories and short course of hydrocodone to assist with his pain.  This could be more of a muscle strain type issue.  Repeat abdominal exam is benign.  No indication for imaging of his abdomen and pelvis.  Discharge vital signs are normal.  Patient and family understand to return to the ER for new or worsening symptoms  I personally performed the services described in this documentation, which was scribed in my presence. The recorded information has been reviewed and is accurate.        Azalia Bilis, MD 03/28/15 [phone]

## 2015-03-27 NOTE — ED Notes (Signed)
Pt is in stable condition upon d/c and is escorted from ED via wheelchair. 

## 2015-03-27 NOTE — ED Notes (Signed)
Pt has late stage dementia. Family reports that pt was c/o left sided chest pain starting yesterday. Pt unable to sleep tonight. Pt currently denies CP and cannot recall details of CP.

## 2015-03-27 NOTE — Discharge Instructions (Signed)

## 2015-05-09 DEATH — deceased

## 2017-03-16 IMAGING — CR DG CHEST 2V
2 series · 2 of 2 positions shown · non-contrast
Comparison: 10/05/2013

CLINICAL DATA: Chest pain this evening, former smoker

EXAM:
CHEST  2 VIEW

[chest lat]
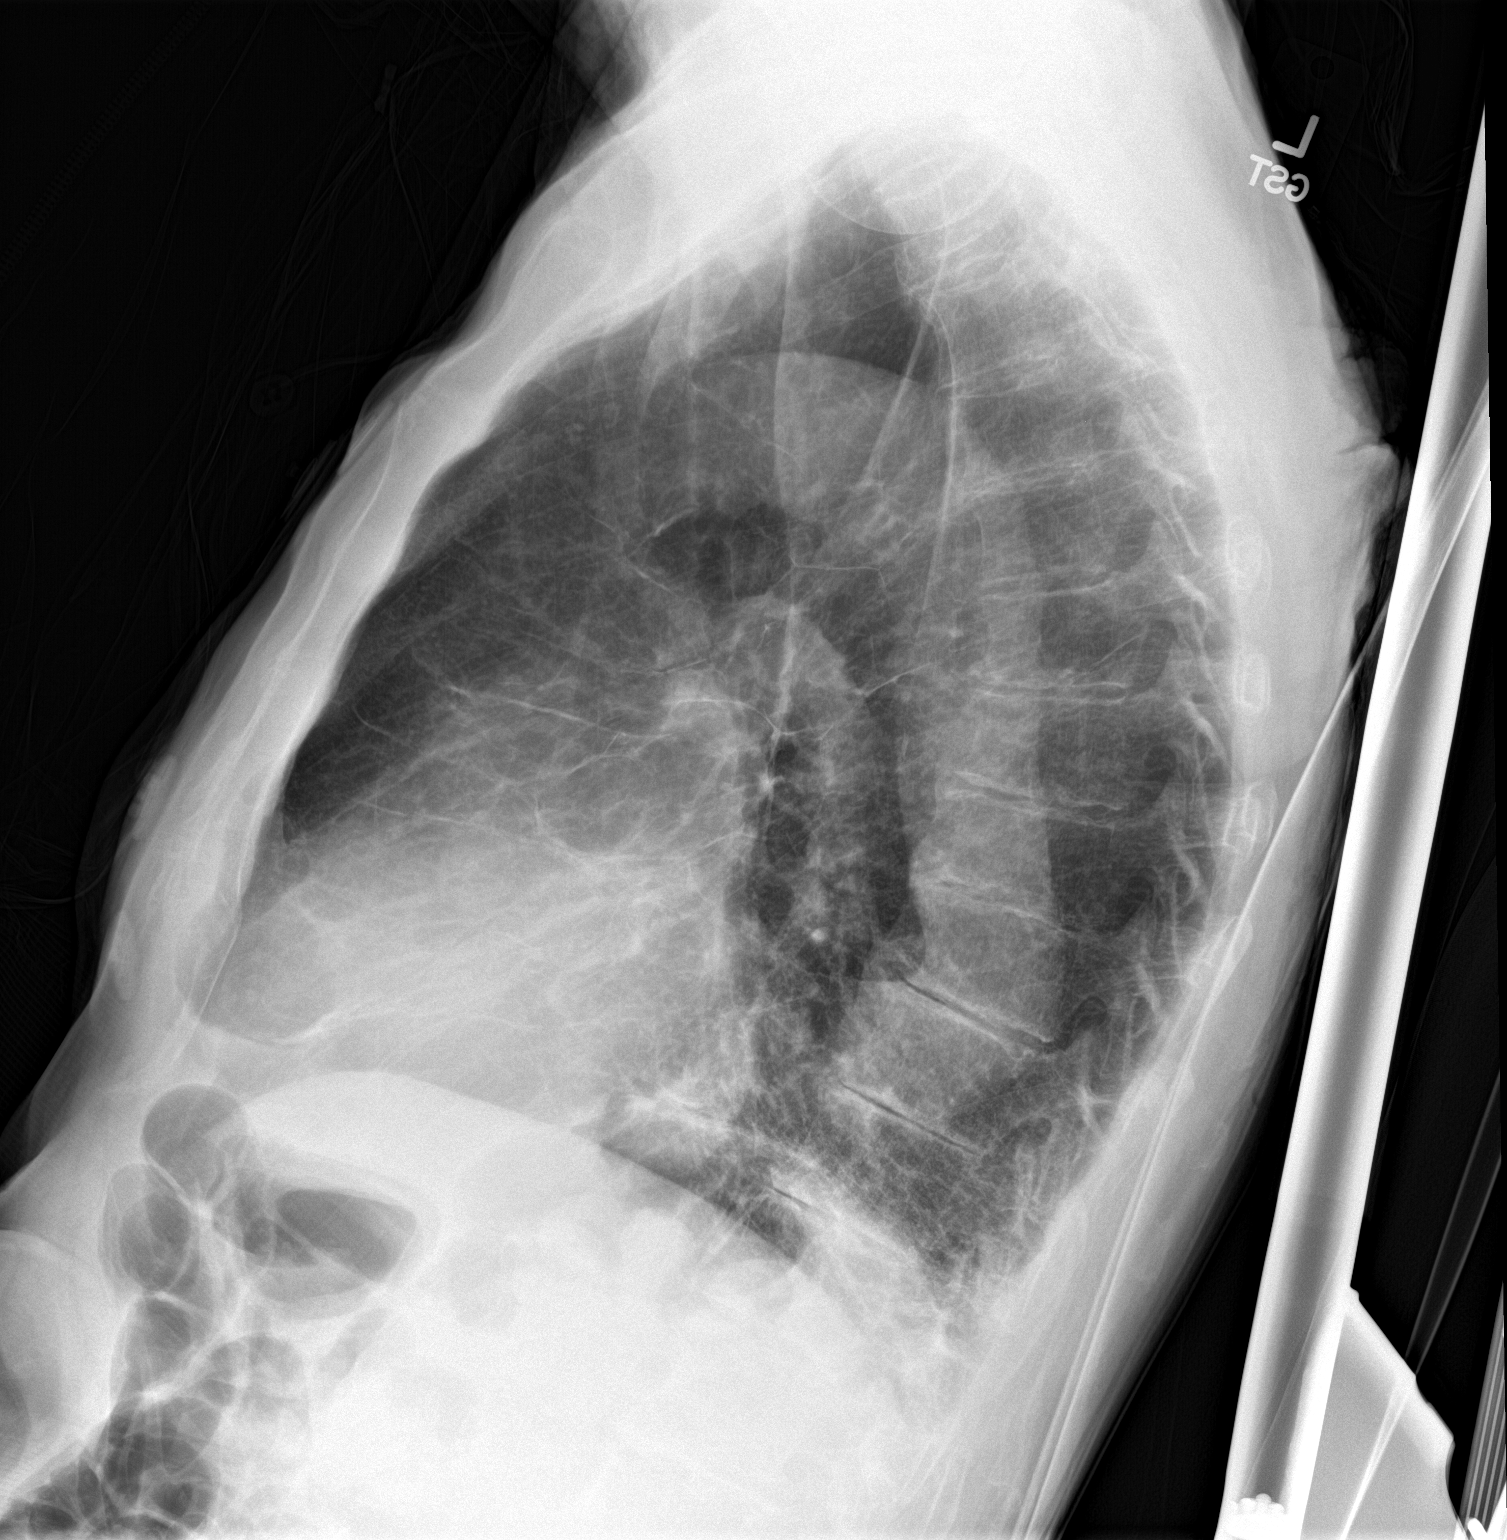

[chest ap]
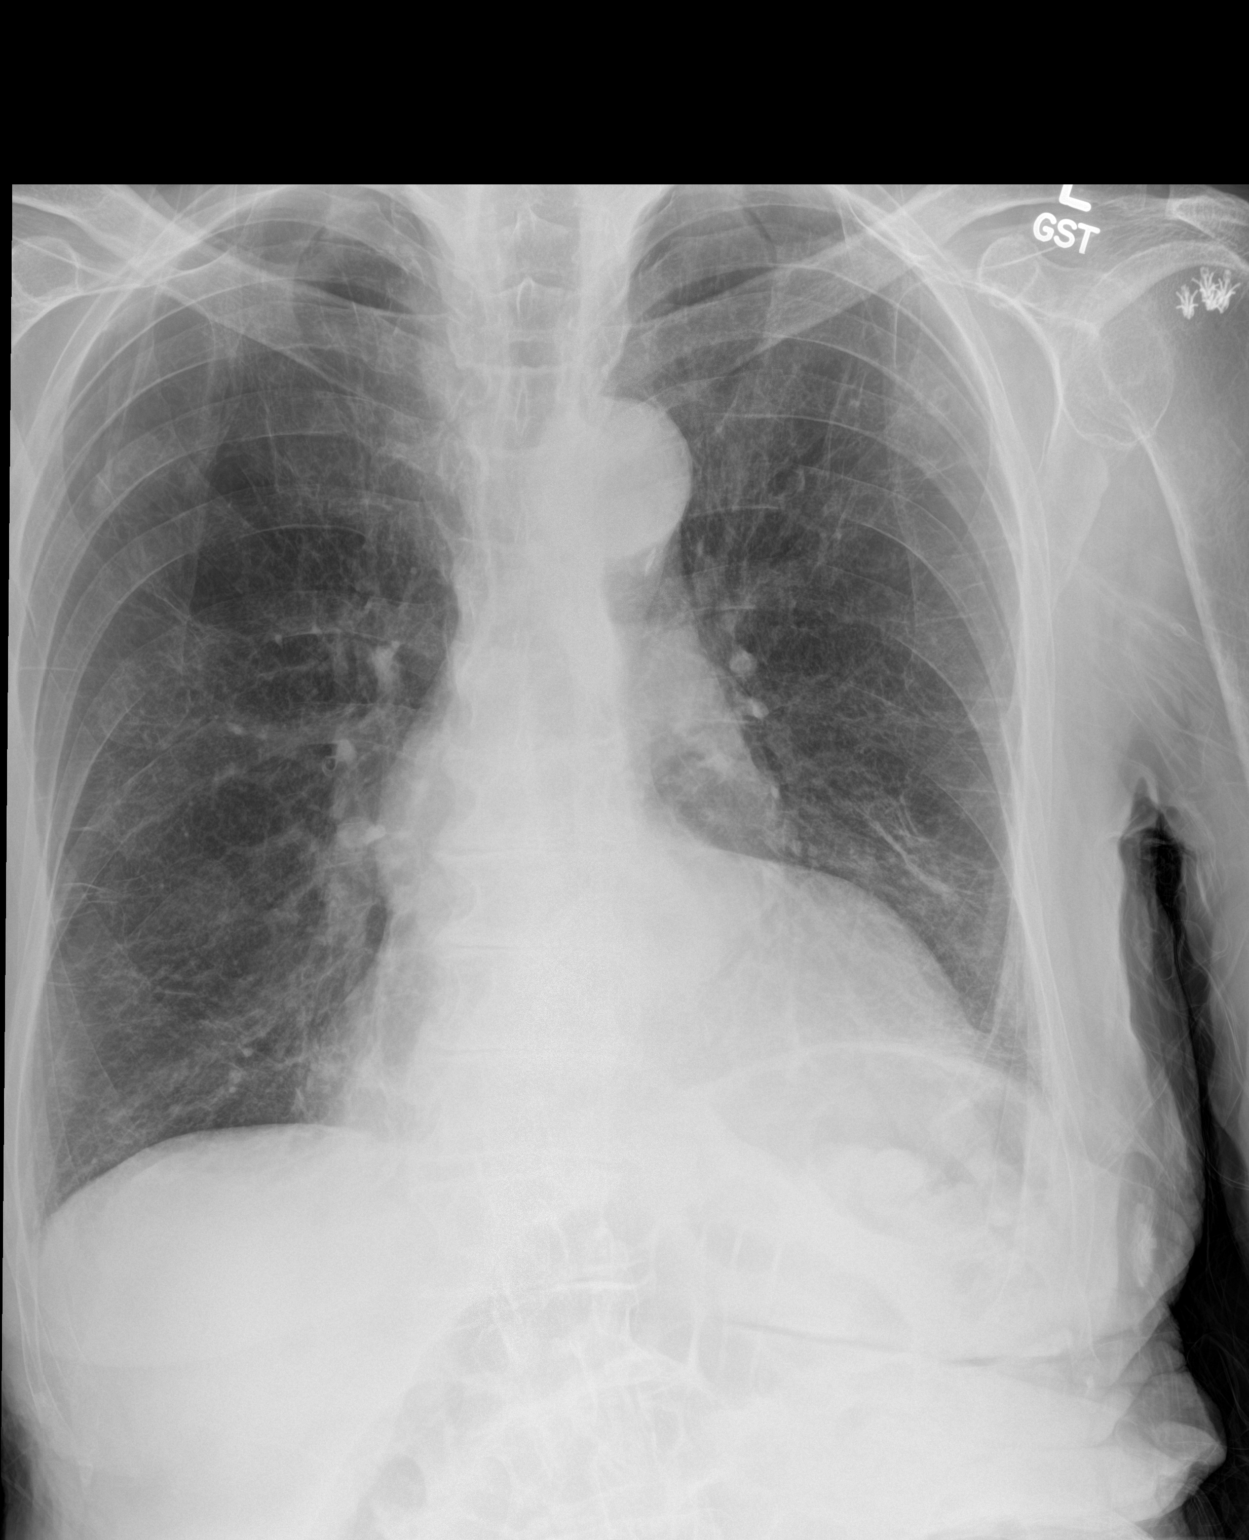

[2 of 2 positions shown; findings below may reference images not displayed]

FINDINGS: Enlargement of cardiac silhouette.

Atherosclerotic calcification of a tortuous thoracic aorta.

Pulmonary vascularity normal.

Emphysematous and bronchitic changes consistent with COPD.

Scattered interstitial prominence and parenchymal scarring.

No acute infiltrate, pleural effusion or pneumothorax.

Bones demineralized.
IMPRESSION: Changes of COPD and chronic lung disease.

No acute abnormalities.

## 2017-03-16 IMAGING — CT CT ANGIO CHEST
2 of 9 series · 19 of 46 positions shown · IV contrast (APPLIED)
Comparison: 10/05/2013

CLINICAL DATA: Left-sided chest pain.

EXAM:
CT ANGIOGRAPHY CHEST WITH CONTRAST
TECHNIQUE: Multidetector CT imaging of the chest was performed using the
standard protocol during bolus administration of intravenous
contrast. Multiplanar CT image reconstructions and MIPs were
obtained to evaluate the vascular anatomy.
CONTRAST:  100mL OMNIPAQUE IOHEXOL 350 MG/ML SOLN

[Series 5: thins · axial · 0.63mm/px · z∈[+1196,+1488]mm · 16 of 330 slices shown]
[im 19/330  lung]
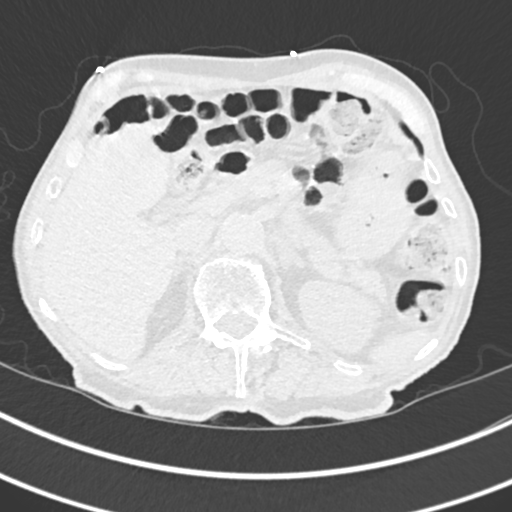
[im 37/330  soft-tissue]
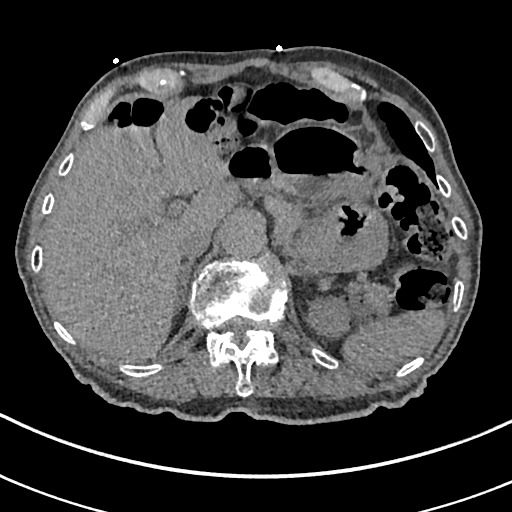
[im 55/330  lung]
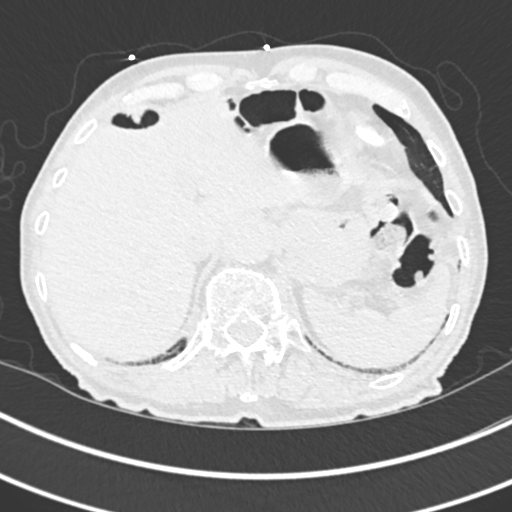
[im 74/330  soft-tissue]
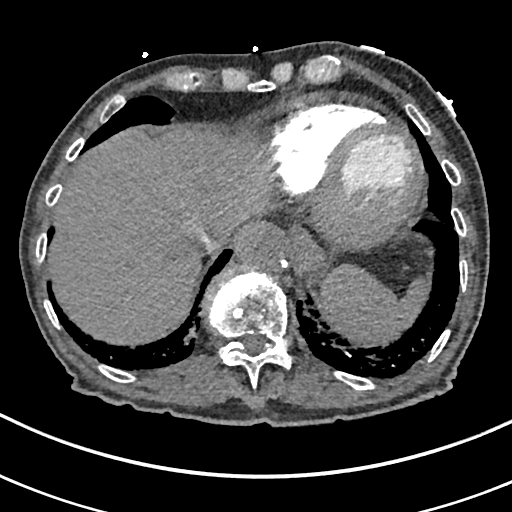
[im 92/330  lung]
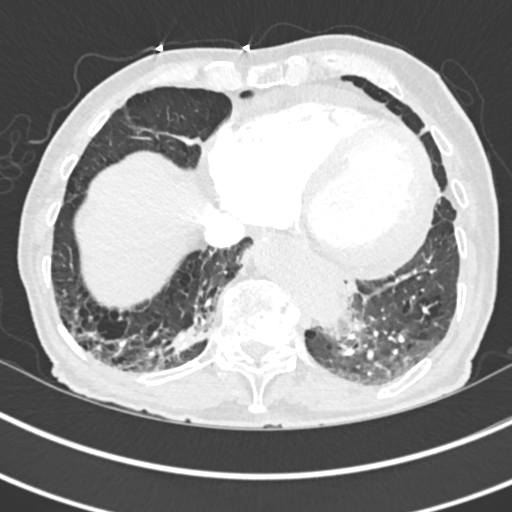
[im 110/330  soft-tissue]
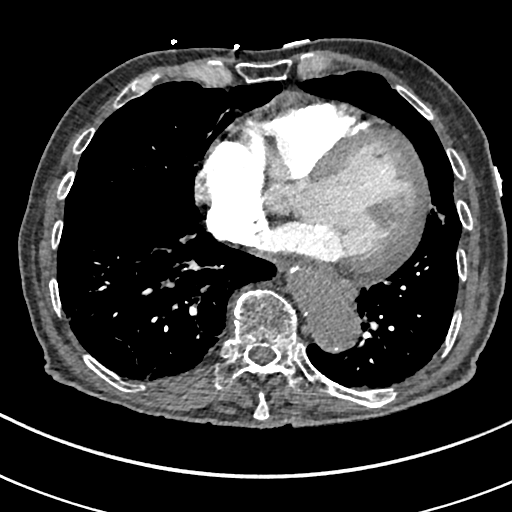
[im 128/330  lung]
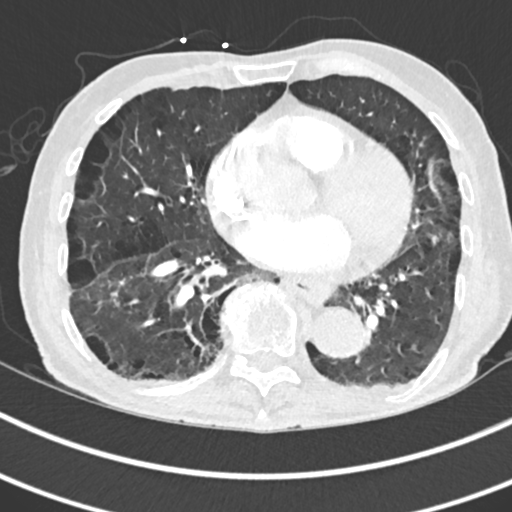
[im 147/330  soft-tissue]
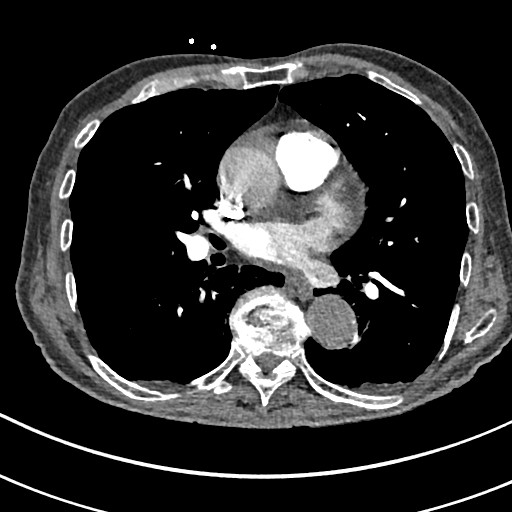
[im 183/330  lung]
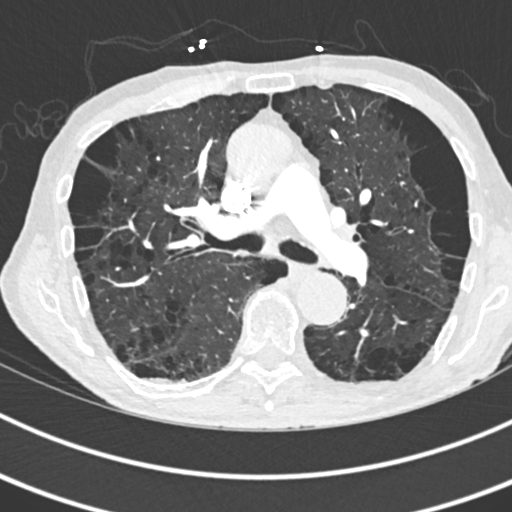
[im 202/330  soft-tissue]
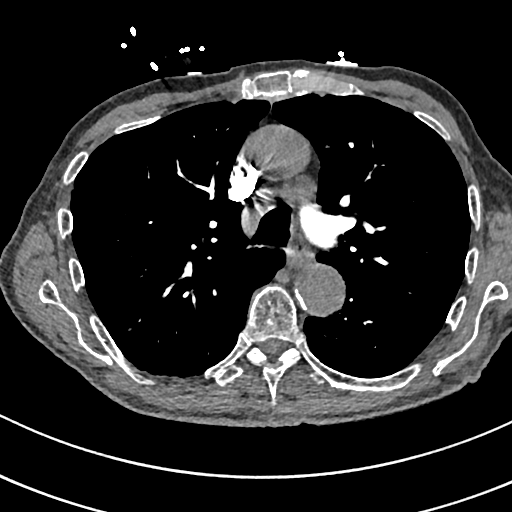
[im 220/330  lung]
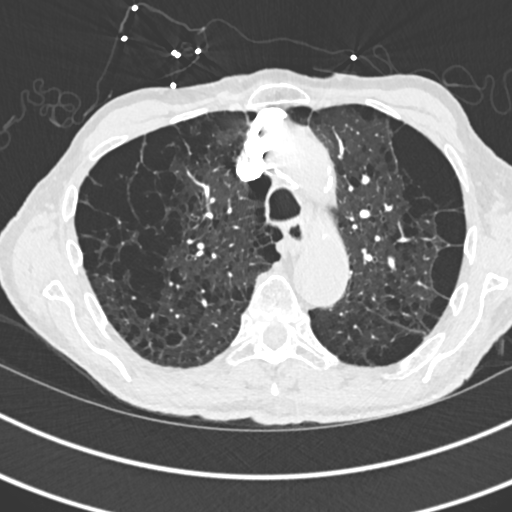
[im 238/330  soft-tissue]
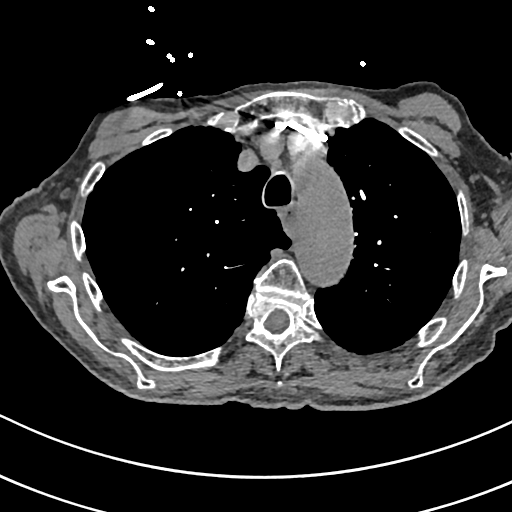
[im 256/330  lung]
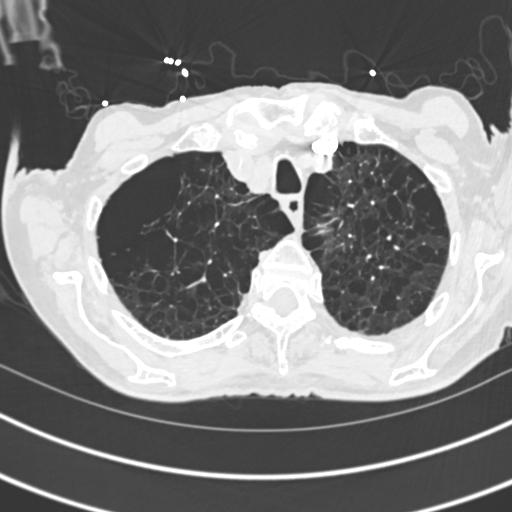
[im 275/330  soft-tissue]
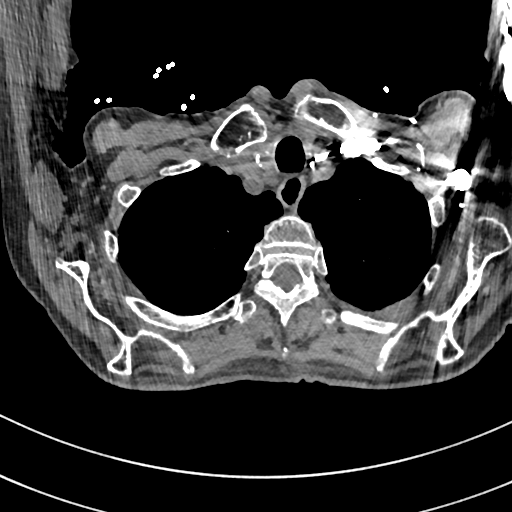
[im 293/330  lung]
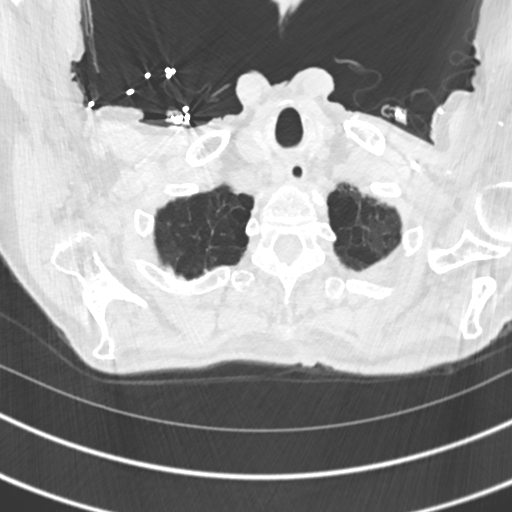
[im 311/330  soft-tissue]
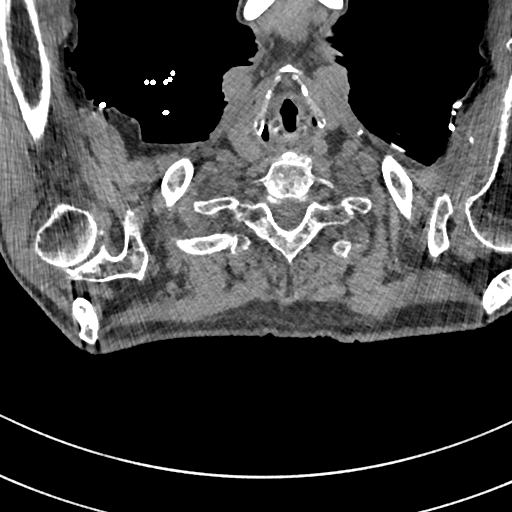

[Series 7: coronal mpr · coronal · 0.64mm/px · 3 of 119 slices shown]
[im 30/119  soft-tissue]
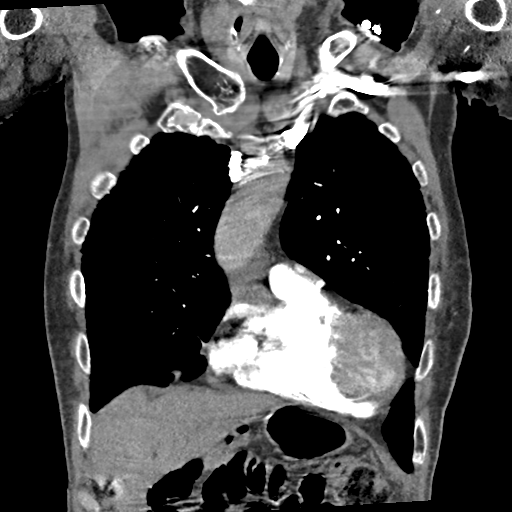
[im 60/119  soft-tissue]
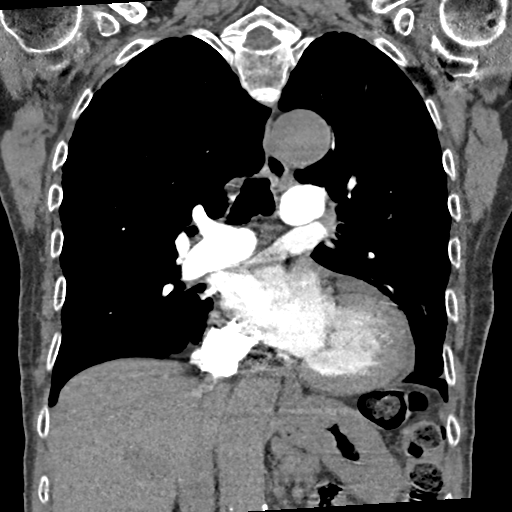
[im 89/119  soft-tissue]
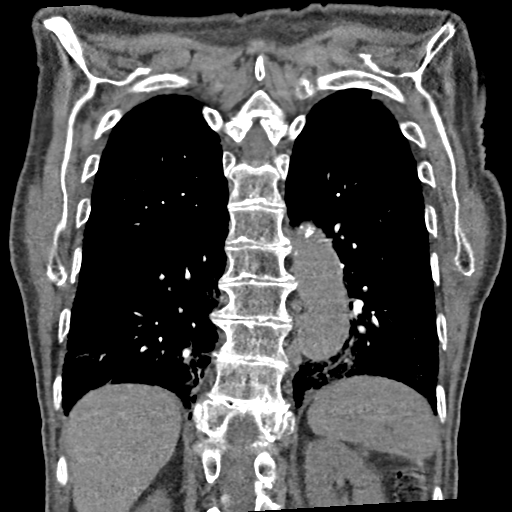

[19 of 46 positions shown; findings below may reference images not displayed]

FINDINGS: Technically adequate study with good opacification of the central
and segmental pulmonary arteries. No focal filling defects. No
evidence of significant pulmonary embolus.

Normal heart size. Small pericardial effusion. Coronary artery
calcifications. Normal caliber thoracic aorta with calcification.
Esophagus is decompressed. No significant lymphadenopathy in the
chest.

Prominent emphysematous changes throughout the lungs with apical
bulla. No focal consolidation or airspace disease. Atelectasis and
linear scarring in the lung bases. No pleural effusions. No
pneumothorax.

Included portions of the upper abdominal organs are grossly
unremarkable. Degenerative changes in the spine. No destructive bone
lesions.

Review of the MIP images confirms the above findings.
IMPRESSION: No evidence of significant pulmonary embolus. Emphysematous changes
throughout the lungs. Atelectasis and scarring in the lung bases. No
evidence of active consolidation.
